# Patient Record
Sex: Female | Born: 1950 | Race: White | Hispanic: No | Marital: Married | Smoking: Former smoker
Health system: Southern US, Community
[De-identification: ages and names within clinical notes are randomized; demographics above are authoritative.]

## PROBLEM LIST (undated history)

## (undated) DIAGNOSIS — I10 Essential (primary) hypertension: Secondary | ICD-10-CM

## (undated) DIAGNOSIS — M797 Fibromyalgia: Secondary | ICD-10-CM

## (undated) DIAGNOSIS — E785 Hyperlipidemia, unspecified: Secondary | ICD-10-CM

## (undated) HISTORY — DX: Essential (primary) hypertension: I10

## (undated) HISTORY — PX: BLADDER SURGERY: SHX569

## (undated) HISTORY — DX: Fibromyalgia: M79.7

## (undated) HISTORY — DX: Hyperlipidemia, unspecified: E78.5

## (undated) HISTORY — PX: TMJ ARTHROPLASTY: SHX1066

## (undated) HISTORY — PX: BACK SURGERY: SHX140

---

## 2019-10-14 ENCOUNTER — Ambulatory Visit (INDEPENDENT_AMBULATORY_CARE_PROVIDER_SITE_OTHER): Payer: Medicare Other | Admitting: Pulmonary Disease

## 2019-10-14 ENCOUNTER — Other Ambulatory Visit: Payer: Self-pay

## 2019-10-14 ENCOUNTER — Telehealth: Payer: Self-pay | Admitting: Pulmonary Disease

## 2019-10-14 ENCOUNTER — Encounter: Payer: Self-pay | Admitting: Pulmonary Disease

## 2019-10-14 VITALS — BP 122/70 | HR 67 | Temp 99.1°F | Ht 72.0 in | Wt 293.4 lb

## 2019-10-14 DIAGNOSIS — R05 Cough: Secondary | ICD-10-CM | POA: Diagnosis not present

## 2019-10-14 DIAGNOSIS — R059 Cough, unspecified: Secondary | ICD-10-CM

## 2019-10-14 NOTE — Telephone Encounter (Signed)
Called and spoke with patient to let her know that I did not see any mention in Dr. Cindi Carbon note that she needed to do a sputum sample. Patient also lives in [place] and is not local to pick up speciman cup. Informed her I could send a message to Dr. Francine Graven to get clarification and she said it was ok not to. She asked why she had to wait 2 months to see him again since she is sick and not feeling well now. Informed her that Dr. Francine Graven must have felt like she was ok to go 2 months but if she started feeling worse or wanted to be seen sooner then for her to give [place] a call and we would get her in. Patient advised understanding and stated she was very impressed with our office today. Nothing further needed at this time.

## 2019-10-14 NOTE — Progress Notes (Signed)
Synopsis: Referred by Marrian Salvage, PA for chronic cough  Subjective:   PATIENT ID: Denise Stephenson GENDER: female DOB: 1950/03/05, MRN: [id number]  HPI  Chief Complaint  Patient presents with  . Consult    cough with yellowish brown sputum   Denise Stephenson is a 69 year old woman, former smoker with with history of hypertension, hyperlipidemia and fibromyalgia who is referred to pulmonary clinic for chronic cough.   She reports having a cough since August in which she was given steroids and a Z-pack with some improvement but the cough has lingered. She is producing sputum. Denies hemoptysis. She has had issues with allergies but denies sinus congestion. She also denies reflux symptoms. She was evaluated at an allergy and asthma center in [place], [place] in which she was started on flonase and a daily allergy medicine. She also has an albuterol inhaler and nebulizer treatments which have added some relief.    She has a 10 pack year smoking history. She quit in 1982. Her sister had COPD and lung cancer.   Past Medical History:  Diagnosis Date  . Fibromyalgia   . Hyperlipidemia   . Hypertension      Family History  Problem Relation Age of Onset  . COPD Sister   . Lung cancer Sister      Social History   Socioeconomic History  . Marital status: Married    Spouse name: Not on file  . Number of children: Not on file  . Years of education: Not on file  . Highest education level: Not on file  Occupational History  . Not on file  Tobacco Use  . Smoking status: Former Smoker    Packs/day: 1.50    Years: 10.00    Pack years: 15.00    Types: Cigarettes    Start date: 10/14/1970    Quit date: 01/25/1980    Years since quitting: 39.7  . Smokeless tobacco: Never Used  Substance and Sexual Activity  . Alcohol use: Not Currently  . Drug use: Not on file  . Sexual activity: Not on file  Other Topics Concern  . Not on file  Social History Narrative  . Not on file   Social  Determinants of Health   Financial Resource Strain:   . Difficulty of Paying Living Expenses: Not on file  Food Insecurity:   . Worried About Programme researcher, broadcasting/film/video in the Last Year: Not on file  . Ran Out of Food in the Last Year: Not on file  Transportation Needs:   . Lack of Transportation (Medical): Not on file  . Lack of Transportation (Non-Medical): Not on file  Physical Activity:   . Days of Exercise per Week: Not on file  . Minutes of Exercise per Session: Not on file  Stress:   . Feeling of Stress : Not on file  Social Connections:   . Frequency of Communication with Friends and Family: Not on file  . Frequency of Social Gatherings with Friends and Family: Not on file  . Attends Religious Services: Not on file  . Active Member of Clubs or Organizations: Not on file  . Attends Banker Meetings: Not on file  . Marital Status: Not on file  Intimate Partner Violence:   . Fear of Current or Ex-Partner: Not on file  . Emotionally Abused: Not on file  . Physically Abused: Not on file  . Sexually Abused: Not on file     Allergies  Allergen Reactions  .  Amoxicillin Swelling     Outpatient Medications Prior to Visit  Medication Sig Dispense Refill  . amitriptyline (ELAVIL) 10 MG tablet Take 10 mg by mouth at bedtime.    Marland Kitchen amlodipine-atorvastatin (CADUET) 2.5-20 MG tablet Take 1 tablet by mouth daily.    Marland Kitchen atenolol (TENORMIN) 25 MG tablet Take by mouth daily.    Marland Kitchen lisinopril-hydrochlorothiazide (ZESTORETIC) 20-12.5 MG tablet Take 1 tablet by mouth daily.    . nabumetone (RELAFEN) 750 MG tablet Take 750 mg by mouth 2 (two) times daily.    Marland Kitchen oxybutynin (DITROPAN) 5 MG tablet Take 5 mg by mouth 1 day or 1 dose.    . simvastatin (ZOCOR) 40 MG tablet Take 40 mg by mouth daily.    . traZODone (DESYREL) 50 MG tablet Take 50 mg by mouth at bedtime.     No facility-administered medications prior to visit.    Review of Systems  Constitutional: Negative for chills,  diaphoresis, fever, malaise/fatigue and weight loss.  HENT: Negative for congestion, sinus pain and sore throat.   Eyes: Negative for pain.  Respiratory: Positive for cough and sputum production. Negative for hemoptysis, shortness of breath and wheezing.   Cardiovascular: Negative for chest pain, palpitations, orthopnea, claudication, leg swelling and PND.  Gastrointestinal: Negative for abdominal pain, blood in stool, diarrhea, heartburn, melena, nausea and vomiting.  Genitourinary: Negative for dysuria and hematuria.  Musculoskeletal: Negative for joint pain and myalgias.  Skin: Negative for itching and rash.  Neurological: Negative for dizziness, weakness and headaches.  Endo/Heme/Allergies: Positive for environmental allergies. Does not bruise/bleed easily.  Psychiatric/Behavioral: Negative.    Objective:  Physical Exam Constitutional:      General: She is not in acute distress.    Appearance: Normal appearance. She is obese. She is not ill-appearing.  HENT:     Head: Normocephalic.     Nose: Nose normal.     Mouth/Throat:     Mouth: Mucous membranes are moist.     Pharynx: Oropharynx is clear.  Eyes:     Extraocular Movements: Extraocular movements intact.     Conjunctiva/sclera: Conjunctivae normal.     Pupils: Pupils are equal, round, and reactive to light.  Cardiovascular:     Rate and Rhythm: Normal rate and regular rhythm.     Pulses: Normal pulses.     Heart sounds: Normal heart sounds. No murmur heard.   Pulmonary:     Effort: Pulmonary effort is normal.     Breath sounds: Normal breath sounds.  Abdominal:     General: Bowel sounds are normal.     Palpations: Abdomen is soft.  Musculoskeletal:     Cervical back: Neck supple.     Right lower leg: No edema.     Left lower leg: No edema.  Lymphadenopathy:     Cervical: No cervical adenopathy.  Skin:    General: Skin is warm and dry.  Neurological:     General: No focal deficit present.     Mental Status: She  is alert and oriented to person, place, and time. Mental status is at baseline.     Gait: Gait normal.  Psychiatric:        Mood and Affect: Mood normal.        Behavior: Behavior normal.        Thought Content: Thought content normal.        Judgment: Judgment normal.     Vitals:   10/14/19 0936  BP: 122/70  Pulse: 67  Temp: 99.1  F (37.3 C)  TempSrc: Oral  SpO2: 96%  Weight: 293 lb 6.4 oz (133.1 kg)  Height: 6' (1.829 m)     Assessment & Plan:   Cough - Plan: CT Chest Wo Contrast, Pulmonary function test  Discussion: Denise Stephenson is a 89 year old woman, former smoker with with history of hypertension, hyperlipidemia and fibromyalgia who is referred to pulmonary clinic for chronic cough.   Her cough may be related to upper airway cough syndrome, environmental allergies and/or underlying reactive airways disease. We will obtain full pulmonary function testing and check a CT chest scan based on her brief smoking history and family history of lung cancer.   She is to continue taking the daily allergy medicine and nasal spray per the Allergy/Asthma clinic in [place], [place]. We will try to obtain these records. She is to continue albuterol inhaler or nebulizer treatments as needed.  We will give a call with the PFT and CT chest results.   She is to follow up in 2 months  Melody Comas, MD [facility] Office: [phone]    Current Outpatient Medications:  .  amitriptyline (ELAVIL) 10 MG tablet, Take 10 mg by mouth at bedtime., Disp: , Rfl:  .  amlodipine-atorvastatin (CADUET) 2.5-20 MG tablet, Take 1 tablet by mouth daily., Disp: , Rfl:  .  atenolol (TENORMIN) 25 MG tablet, Take by mouth daily., Disp: , Rfl:  .  lisinopril-hydrochlorothiazide (ZESTORETIC) 20-12.5 MG tablet, Take 1 tablet by mouth daily., Disp: , Rfl:  .  nabumetone (RELAFEN) 750 MG tablet, Take 750 mg by mouth 2 (two) times daily., Disp: , Rfl:  .  oxybutynin (DITROPAN) 5 MG tablet,  Take 5 mg by mouth 1 day or 1 dose., Disp: , Rfl:  .  simvastatin (ZOCOR) 40 MG tablet, Take 40 mg by mouth daily., Disp: , Rfl:  .  traZODone (DESYREL) 50 MG tablet, Take 50 mg by mouth at bedtime., Disp: , Rfl:

## 2019-10-14 NOTE — Patient Instructions (Signed)
We will check a CT chest scan and pulmonary functions Use nebulizer treatments as needed Follow up in 2 months

## 2019-10-17 ENCOUNTER — Encounter: Payer: Self-pay | Admitting: Pulmonary Disease

## 2019-11-04 ENCOUNTER — Other Ambulatory Visit: Payer: Self-pay

## 2019-11-04 ENCOUNTER — Ambulatory Visit (HOSPITAL_COMMUNITY)
Admission: RE | Admit: 2019-11-04 | Discharge: 2019-11-04 | Disposition: A | Discharge status: Home / Self Care | Payer: Medicare Other | Source: Ambulatory Visit | Attending: Pulmonary Disease | Admitting: Pulmonary Disease

## 2019-11-04 DIAGNOSIS — J439 Emphysema, unspecified: Secondary | ICD-10-CM | POA: Insufficient documentation

## 2019-11-04 DIAGNOSIS — I700 Atherosclerosis of aorta: Secondary | ICD-10-CM | POA: Diagnosis not present

## 2019-11-04 DIAGNOSIS — I728 Aneurysm of other specified arteries: Secondary | ICD-10-CM | POA: Insufficient documentation

## 2019-11-04 DIAGNOSIS — R059 Cough, unspecified: Secondary | ICD-10-CM | POA: Insufficient documentation

## 2019-11-11 ENCOUNTER — Telehealth: Payer: Self-pay | Admitting: Pulmonary Disease

## 2019-11-11 DIAGNOSIS — I728 Aneurysm of other specified arteries: Secondary | ICD-10-CM

## 2019-11-11 NOTE — Telephone Encounter (Signed)
Called and spoke with pt letting her know the results of the CT and stated to her due to aneurysm of splenic artery seen, Dr. Francine Graven wants to refer her to vascular surgeon. Pt verbalized understanding. Order has been placed. Nothing further needed.

## 2019-11-11 NOTE — Telephone Encounter (Signed)
Pt had CT performed 11/04/19 and is calling requesting to know the results. Dr. Francine Graven, please advise.

## 2019-11-11 NOTE — Telephone Encounter (Signed)
Patient's CT scan is normal in regards to her lungs. There is an aneurysm of the splenic artery in which she should be referred to vascular surgery for further monitoring and follow up. Please place referral.

## 2019-12-10 ENCOUNTER — Encounter: Payer: Self-pay | Admitting: Vascular Surgery

## 2019-12-10 ENCOUNTER — Ambulatory Visit (INDEPENDENT_AMBULATORY_CARE_PROVIDER_SITE_OTHER): Payer: Medicare Other | Admitting: Vascular Surgery

## 2019-12-10 ENCOUNTER — Other Ambulatory Visit: Payer: Self-pay

## 2019-12-10 VITALS — BP 146/85 | HR 63 | Temp 98.1°F | Resp 20 | Ht 72.0 in | Wt 290.0 lb

## 2019-12-10 DIAGNOSIS — I728 Aneurysm of other specified arteries: Secondary | ICD-10-CM

## 2019-12-10 NOTE — Progress Notes (Signed)
ASSESSMENT & PLAN:  69 y.o. 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  I counseled her that her risk of rupture from this is very low. We will see her again in 1 year with a CT abdomen and pelvis with IV contrast. If aneurysm is stable at that time we can lengthen our surveillance interval. She lives in [place].  I offered her follow-up with my partner Dr. Donnetta Hutching if she so desires.  CHIEF COMPLAINT:   Incidental note of splenic artery aneurysm.  HISTORY:  HISTORY OF PRESENT ILLNESS: Denise Stephenson is a 69 y.o. female with history of HTN, HLA, fibromyalgia. She is a former smoker. A CT scan of the chest was done by her pulmonologist. 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  She is totally asymptomatic.  She denies any abdominal complaints.  Past Medical History:  Diagnosis Date  . Fibromyalgia   . Hyperlipidemia   . Hypertension     Past Surgical History:  Procedure Laterality Date  . BACK SURGERY    . BLADDER SURGERY    . TMJ ARTHROPLASTY      Family History  Problem Relation Age of Onset  . COPD Sister   . Lung cancer Sister     Social History   Socioeconomic History  . Marital status: Married    Spouse name: Not on file  . Number of children: Not on file  . Years of education: Not on file  . Highest education level: Not on file  Occupational History  . Not on file  Tobacco Use  . Smoking status: Former Smoker    Packs/day: 1.50    Years: 10.00    Pack years: 15.00    Types: Cigarettes    Start date: 10/14/1970    Quit date: 01/25/1980    Years since quitting: 39.9  . Smokeless tobacco: Never Used  Vaping Use  . Vaping Use: Never used  Substance and Sexual Activity  . Alcohol use: Not Currently  . Drug use: Not on file  . Sexual activity: Not on file  Other Topics Concern  . Not on file  Social History Narrative  . Not on file   Social Determinants of Health   Financial Resource Strain:   . Difficulty of Paying Living  Expenses: Not on file  Food Insecurity:   . Worried About Charity fundraiser in the Last Year: Not on file  . Ran Out of Food in the Last Year: Not on file  Transportation Needs:   . Lack of Transportation (Medical): Not on file  . Lack of Transportation (Non-Medical): Not on file  Physical Activity:   . Days of Exercise per Week: Not on file  . Minutes of Exercise per Session: Not on file  Stress:   . Feeling of Stress : Not on file  Social Connections:   . Frequency of Communication with Friends and Family: Not on file  . Frequency of Social Gatherings with Friends and Family: Not on file  . Attends Religious Services: Not on file  . Active Member of Clubs or Organizations: Not on file  . Attends Archivist Meetings: Not on file  . Marital Status: Not on file  Intimate Partner Violence:   . Fear of Current or Ex-Partner: Not on file  . Emotionally Abused: Not on file  . Physically Abused: Not on file  . Sexually Abused: Not on file    Allergies  Allergen Reactions  . Amoxicillin Swelling  Current Outpatient Medications  Medication Sig Dispense Refill  . amitriptyline (ELAVIL) 10 MG tablet Take 10 mg by mouth at bedtime.    Marland Kitchen amlodipine-atorvastatin (CADUET) 2.5-20 MG tablet Take 1 tablet by mouth daily.    Marland Kitchen atenolol (TENORMIN) 25 MG tablet Take by mouth daily.    Marland Kitchen lisinopril-hydrochlorothiazide (ZESTORETIC) 20-12.5 MG tablet Take 1 tablet by mouth daily.    . nabumetone (RELAFEN) 750 MG tablet Take 750 mg by mouth 2 (two) times daily.    Marland Kitchen oxybutynin (DITROPAN) 5 MG tablet Take 5 mg by mouth 1 day or 1 dose.    . simvastatin (ZOCOR) 40 MG tablet Take 40 mg by mouth daily.    . traZODone (DESYREL) 50 MG tablet Take 50 mg by mouth at bedtime.     No current facility-administered medications for this visit.    REVIEW OF SYSTEMS:  [X] denotes positive finding, [ ] denotes negative finding Cardiac  Comments:  Chest pain or chest pressure:    Shortness of  breath upon exertion:    Short of breath when lying flat:    Irregular heart rhythm:        Vascular    Pain in calf, thigh, or hip brought on by ambulation:    Pain in feet at night that wakes you up from your sleep:     Blood clot in your veins:    Leg swelling:         Pulmonary    Oxygen at home:    Productive cough:     Wheezing:         Neurologic    Sudden weakness in arms or legs:     Sudden numbness in arms or legs:     Sudden onset of difficulty speaking or slurred speech:    Temporary loss of vision in one eye:     Problems with dizziness:         Gastrointestinal    Blood in stool:     Vomited blood:         Genitourinary    Burning when urinating:     Blood in urine:        Psychiatric    Major depression:         Hematologic    Bleeding problems:    Problems with blood clotting too easily:        Skin    Rashes or ulcers:        Constitutional    Fever or chills:     PHYSICAL EXAM:   Vitals:   12/10/19 1258  BP: (!) 146/85  Pulse: 63  Resp: 20  Temp: 98.1 F (36.7 C)  SpO2: 94%  Weight: 290 lb (131.5 kg)  Height: 6' (1.829 m)    Constitutional: Well appearing in no distress. Appears well nourished.  Neurologic: Normal gait and station. CN intact. No weakness. No sensory loss. Psychiatric: Mood and affect symmetric and appropriate. Eyes: No icterus. No conjunctival pallor. Ears, nose, throat: mucous membranes moist. Midline trachea.  Cardiac: regular rate and rhythm.  Respiratory: unlabored. Abdominal: soft, non-tender, non-distended.  Extremity: No edema.  No cyanosis.  No pallor.  Skin: No gangrene.  No ulceration.  Lymphatic: No Stemmer's sign.  No palpable lymphadenopathy.  DATA REVIEW:   CT chest without contrast personally reviewed. 1.9 cm heavily calcified splenic artery aneurysm mid splenic artery.  Yevonne Aline. Stanford Breed, MD [facility] Phone Number: [phone] 12/10/2019 1:04  PM

## 2019-12-16 ENCOUNTER — Ambulatory Visit: Payer: Medicare Other | Admitting: Pulmonary Disease

## 2020-01-28 ENCOUNTER — Ambulatory Visit: Payer: Medicare Other | Admitting: Pulmonary Disease

## 2021-11-29 IMAGING — CT CT CHEST W/O CM
2 of 4 series · 15 of 36 positions shown, 18 images · non-contrast
Comparison: None.

CLINICAL DATA: Persistent cough for several weeks

EXAM:
CT CHEST WITHOUT CONTRAST
TECHNIQUE: Multidetector CT imaging of the chest was performed following the
standard protocol without IV contrast.

[Series 2: routine chest without · axial · non-contrast · 0.71mm/px · z∈[-123,+173]mm · 12 of 176 slices shown, 15 images]
[im 14/176  mediastinal]
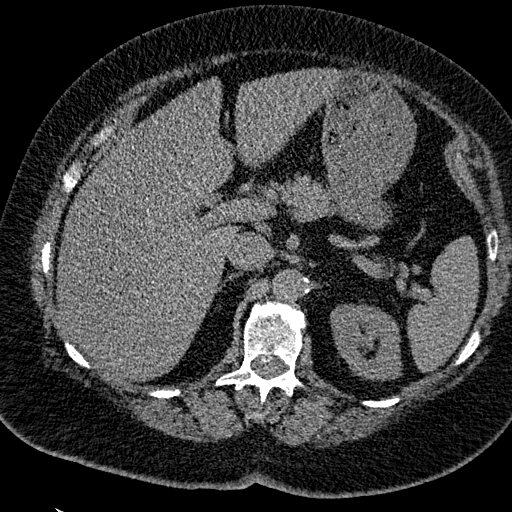
[im 14/176  lung]
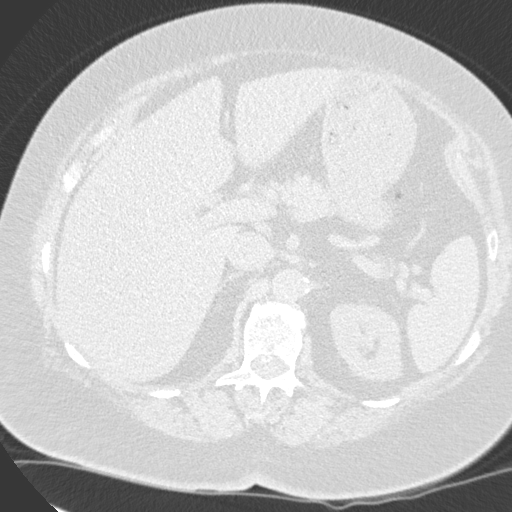
[im 27/176  lung]
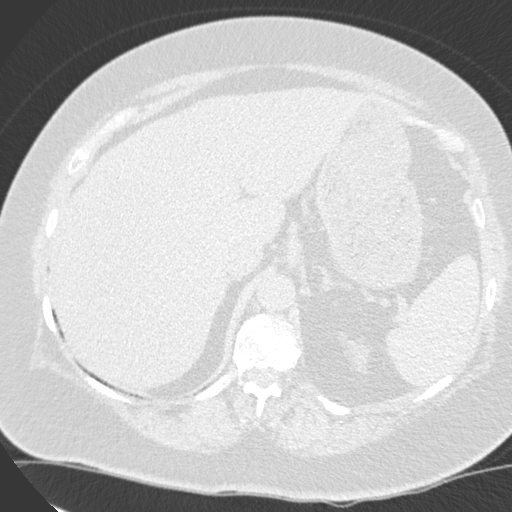
[im 41/176  lung]
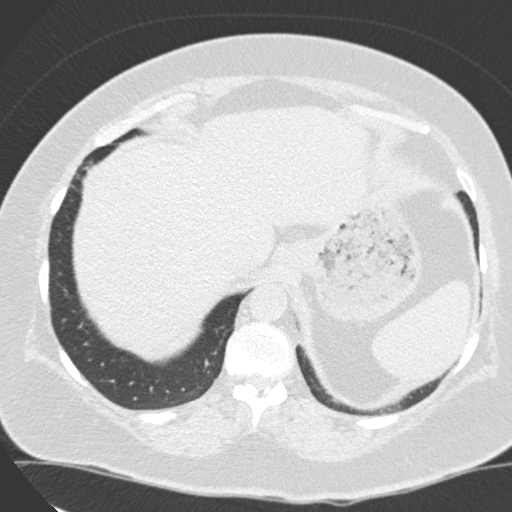
[im 54/176  lung]
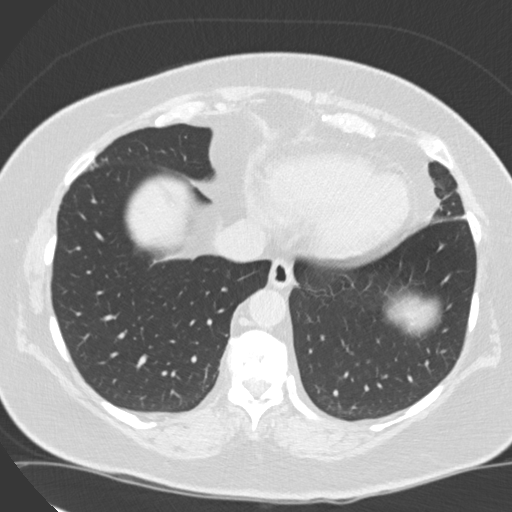
[im 68/176  mediastinal]
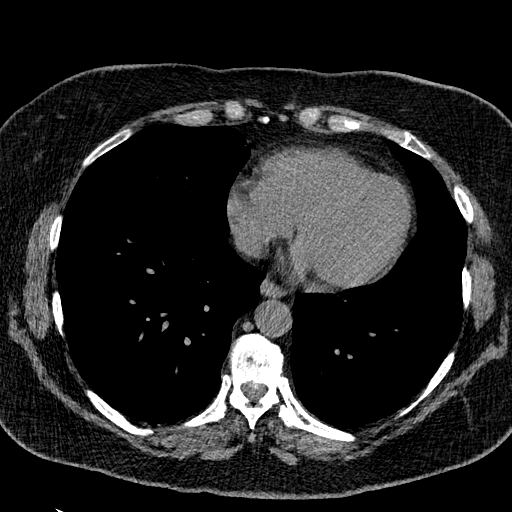
[im 68/176  lung]
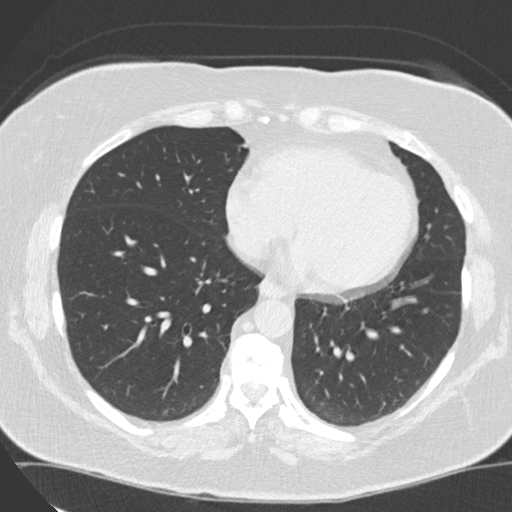
[im 81/176  lung]
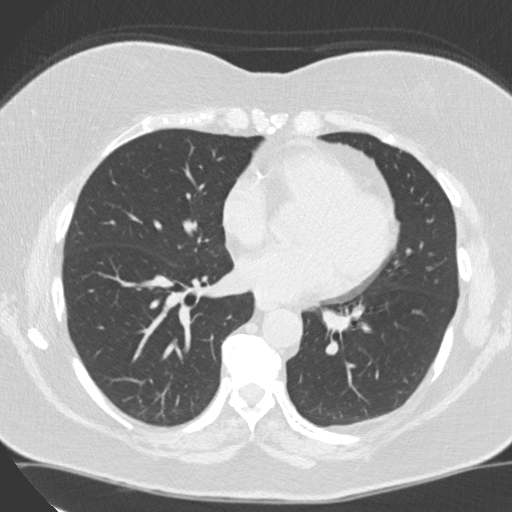
[im 95/176  lung]
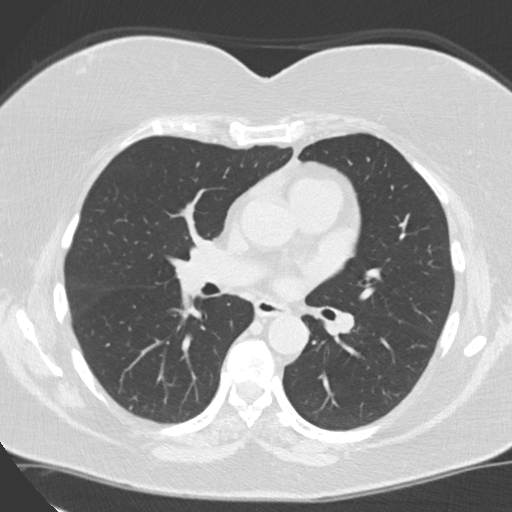
[im 108/176  lung]
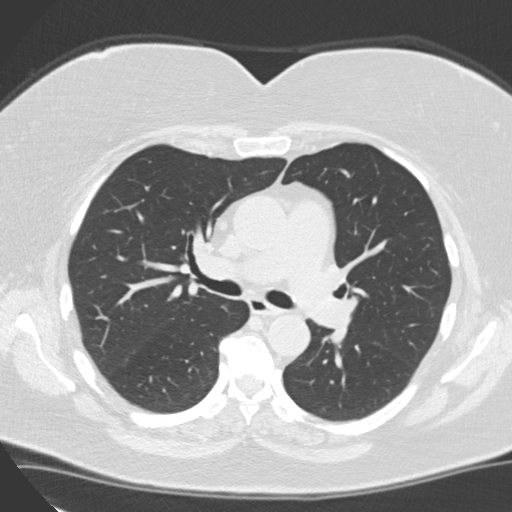
[im 122/176  mediastinal]
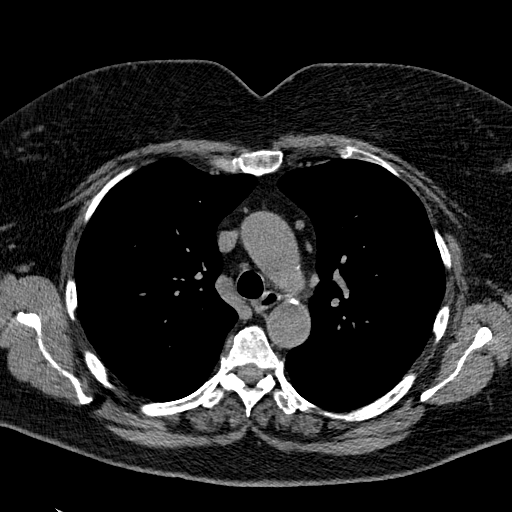
[im 122/176  lung]
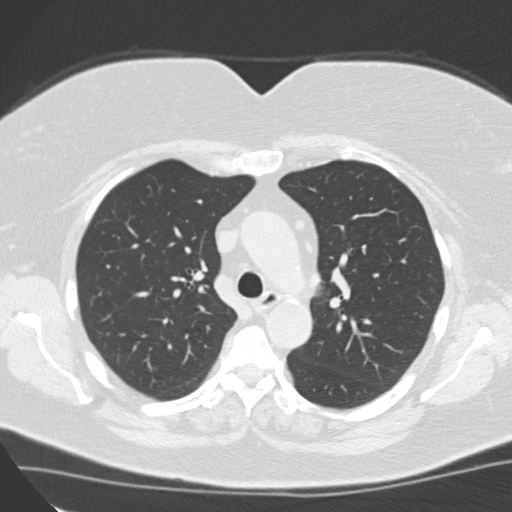
[im 135/176  lung]
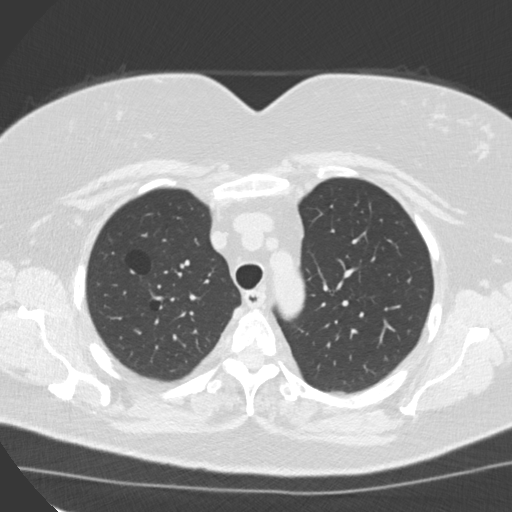
[im 149/176  lung]
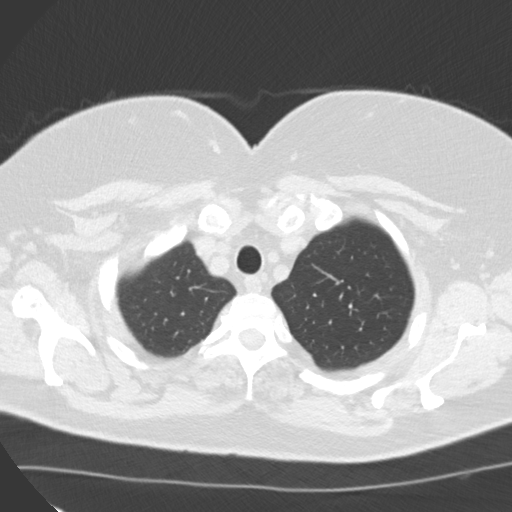
[im 162/176  lung]
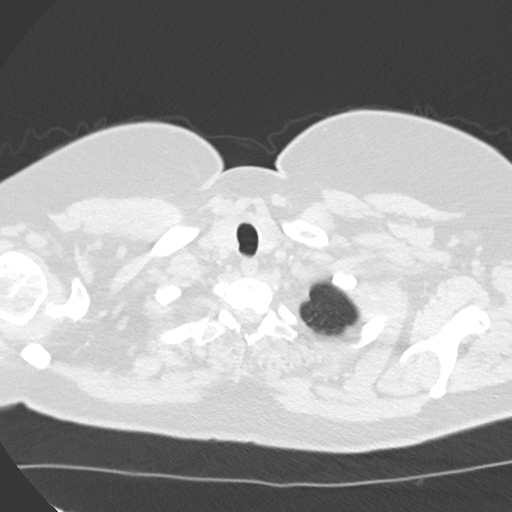

[Series 5: coronal · coronal · 0.73mm/px · 3 of 182 slices shown]
[im 37/182  lung]
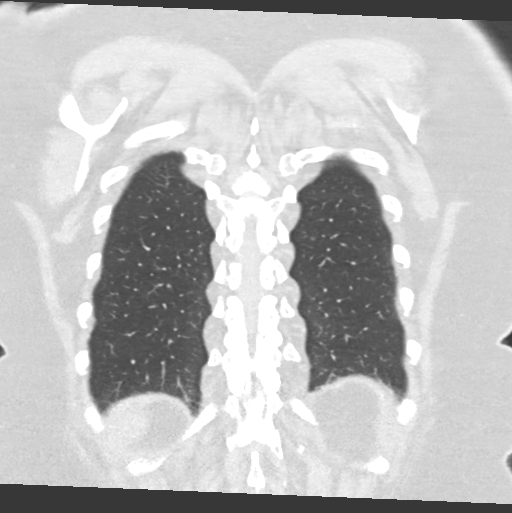
[im 73/182  lung]
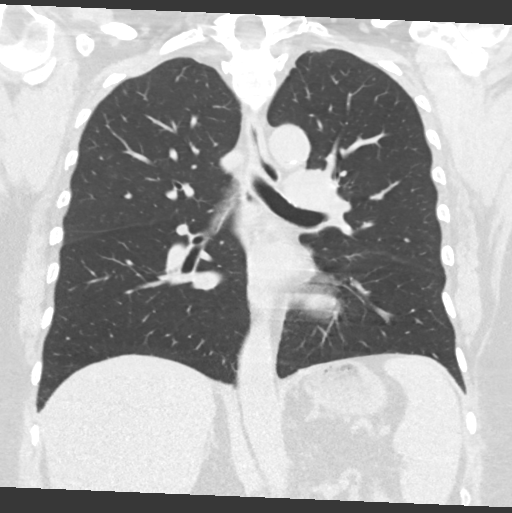
[im 109/182  lung]
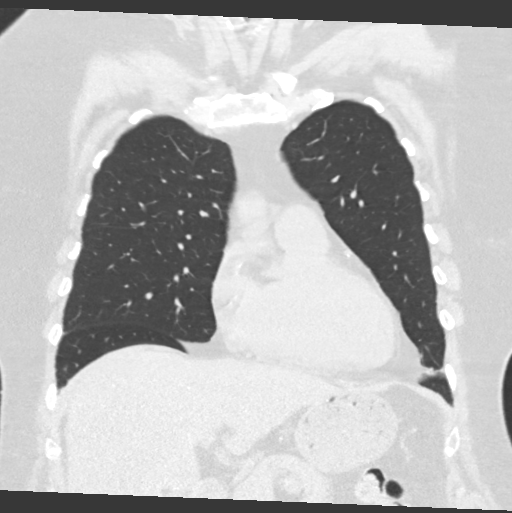

[15 of 36 positions shown; findings below may reference images not displayed]

FINDINGS: Cardiovascular: Thoracic aorta demonstrates some atherosclerotic
calcifications. No aneurysmal dilatation is seen. No cardiac
enlargement is noted. Coronary calcifications are seen. The
pulmonary outflow tract is not significantly enlarged.

Mediastinum/Nodes: Thoracic inlet is within normal limits. No hilar
or mediastinal adenopathy is noted. The esophagus is well visualized
and within normal limits.

Lungs/Pleura: Lungs are well aerated bilaterally. No focal
infiltrate or sizable effusion is noted. Mild emphysematous changes
are noted.

Upper Abdomen: Visualized upper abdomen demonstrates a splenic
artery aneurysm measuring approximately 1.8 cm. No other focal
abnormality is seen.

Musculoskeletal: Degenerative changes of the thoracic spine are
noted.
IMPRESSION: 1.8 cm splenic artery aneurysm. Further workup can be performed as
clinically indicated.

No acute abnormality within the chest to correspond with the given
clinical history.

Aortic Atherosclerosis (VHA76-RTR.R) and Emphysema (VHA76-1IR.S).
